# Patient Record
Sex: Male | Born: 1995 | Race: White | Hispanic: No | Marital: Single | Smoking: Never smoker
Health system: Southern US, Community
[De-identification: ages and names within clinical notes are randomized; demographics above are authoritative.]

---

## 2018-05-06 ENCOUNTER — Ambulatory Visit (INDEPENDENT_AMBULATORY_CARE_PROVIDER_SITE_OTHER): Payer: Self-pay

## 2018-05-06 ENCOUNTER — Other Ambulatory Visit: Payer: Self-pay | Admitting: Gerontology

## 2018-05-06 DIAGNOSIS — M79644 Pain in right finger(s): Secondary | ICD-10-CM

## 2018-05-06 DIAGNOSIS — S62664A Nondisplaced fracture of distal phalanx of right ring finger, initial encounter for closed fracture: Secondary | ICD-10-CM

## 2019-07-07 ENCOUNTER — Emergency Department (INDEPENDENT_AMBULATORY_CARE_PROVIDER_SITE_OTHER)
Admission: EM | Admit: 2019-07-07 | Discharge: 2019-07-07 | Disposition: A | Discharge status: Home / Self Care | Payer: Self-pay | Source: Home / Self Care | Attending: Family Medicine | Admitting: Family Medicine

## 2019-07-07 DIAGNOSIS — L255 Unspecified contact dermatitis due to plants, except food: Secondary | ICD-10-CM

## 2019-07-07 MED ORDER — PREDNISONE 20 MG PO TABS: ORAL_TABLET | ORAL | 0 refills | Status: AC

## 2019-07-07 MED ORDER — METHYLPREDNISOLONE SODIUM SUCC 125 MG IJ SOLR
80.0000 mg | Freq: Once | INTRAMUSCULAR | Status: AC
  Administered 2019-07-07: 10:00:00 80 mg via INTRAMUSCULAR

## 2019-07-07 NOTE — ED Provider Notes (Signed)
[name]    CSN: [id number] Arrival date & time: 07/07/19  0855      History   Chief Complaint Chief Complaint  Patient presents with  . Poison Oak    HPI Scott Everett is a 24 y.o. male.   Patient believes that he contacted poison oak several days ago.  Yesterday he developed mild pruritic rash and swelling above his left eye.  He has also developed scattered pruritic lesions on his forearms.  He feels well otherwise.  The history is provided by the patient.  [name] This is a new problem. The current episode started yesterday. The problem occurs constantly. The problem has been gradually worsening. Nothing aggravates the symptoms. Nothing relieves the symptoms. He has tried nothing for the symptoms.    History reviewed. No pertinent past medical history.  There are no problems to display for this patient.   History reviewed. No pertinent surgical history.     Home Medications    Prior to Admission medications   Medication Sig Start Date End Date Taking? Authorizing Provider  predniSONE (DELTASONE) 20 MG tablet Take one tab by mouth twice daily for 4 days, then one daily for 3 days. Take with food. 07/07/19   Kandra Nicolas, MD    Family History Family History  Problem Relation Age of Onset  . Hypertension Mother   . Hypertension Father   . Bell's palsy Father     Social History Social History   Tobacco Use  . Smoking status: Never Smoker  . Smokeless tobacco: Never Used  Substance Use Topics  . Alcohol use: Yes    Comment: weekly  . Drug use: Not on file     Allergies   Patient has no known allergies.   Review of Systems Review of Systems  Constitutional: Negative for chills, diaphoresis, fatigue and fever.  Skin: Positive for rash.  All other systems reviewed and are negative.    Physical Exam Triage Vital Signs ED Triage Vitals  Enc Vitals Group     BP 07/07/19 0904 (!) 142/82     Pulse Rate 07/07/19 0904 91   Resp 07/07/19 0904 16     Temp 07/07/19 0904 98.2 F (36.8 C)     Temp Source 07/07/19 0904 Oral     SpO2 07/07/19 0904 100 %     Weight --      Height --      Head Circumference --      Peak Flow --      Pain Score 07/07/19 0902 0     Pain Loc --      Pain Edu? --      Excl. in [place]? --    No data found.  Updated Vital Signs BP (!) 142/82 (BP Location: Right Arm)   Pulse 91   Temp 98.2 F (36.8 C) (Oral)   Resp 16   SpO2 100%   Visual Acuity Right Eye Distance: 20/15 Left Eye Distance: 20/15 Bilateral Distance: 20/15  Right Eye Near:   Left Eye Near:    Bilateral Near:     Physical Exam Vitals and nursing note reviewed.  Constitutional:      General: He is not in acute distress. HENT:     Head: Normocephalic.      Comments: There is mild erythema and swelling above and below patient's left eye but no tenderness to palpation.    Right Ear: External ear normal.     Left Ear: External ear  normal.     Nose: Nose normal.     Mouth/Throat:     Pharynx: Oropharynx is clear.  Eyes:     Pupils: Pupils are equal, round, and reactive to light.  Cardiovascular:     Rate and Rhythm: Normal rate.  Pulmonary:     Effort: Pulmonary effort is normal.  Musculoskeletal:        General: No swelling.  Lymphadenopathy:     Cervical: No cervical adenopathy.  Skin:    General: Skin is warm and dry.          Comments: Patient's forearms have scattered nontender erythematous macules.  Neurological:     Mental Status: He is alert.      UC Treatments / Results  Labs (all labs ordered are listed, but only abnormal results are displayed) Labs Reviewed - No data to display  EKG   Radiology No results found.  Procedures Procedures (including critical care time)  Medications Ordered in UC Medications  methylPREDNISolone sodium succinate (SOLU-MEDROL) 125 mg/2 mL injection 80 mg (has no administration in time range)    Initial Impression / Assessment and Plan / UC  Course  I have reviewed the triage vital signs and the nursing notes.  Pertinent labs & imaging results that were available during my care of the patient were reviewed by me and considered in my medical decision making (see chart for details).    Administered Solumedrol 80mg  IM, followed by prednisone burst/taper. Followup with Family Doctor if not improved in one week.   Final Clinical Impressions(s) / UC Diagnoses   Final diagnoses:  Rhus dermatitis     Discharge Instructions     Begin prednisone Wednesday 07/08/19. May take Benadryl at bedtime as needed for itching.   ED Prescriptions    Medication Sig Dispense Auth. Provider   predniSONE (DELTASONE) 20 MG tablet Take one tab by mouth twice daily for 4 days, then one daily for 3 days. Take with food. 11 tablet 07/10/19, MD        Lattie Haw, MD 07/11/19 [phone]

## 2019-07-07 NOTE — ED Triage Notes (Signed)
Patient presents to Urgent Care with complaints of itchy rash over his left eye since yesterday. 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 Pt has not applied anything to the rash, denies involvement w/ vision.

## 2019-07-07 NOTE — Discharge Instructions (Addendum)
Begin prednisone Wednesday 07/08/19. May take Benadryl at bedtime as needed for itching.

## 2019-12-30 ENCOUNTER — Other Ambulatory Visit: Payer: Self-pay | Admitting: Physician Assistant

## 2019-12-30 ENCOUNTER — Ambulatory Visit (INDEPENDENT_AMBULATORY_CARE_PROVIDER_SITE_OTHER): Payer: Self-pay

## 2019-12-30 ENCOUNTER — Other Ambulatory Visit: Payer: Self-pay

## 2019-12-30 DIAGNOSIS — R52 Pain, unspecified: Secondary | ICD-10-CM

## 2019-12-30 DIAGNOSIS — M25572 Pain in left ankle and joints of left foot: Secondary | ICD-10-CM

## 2020-01-30 IMAGING — DX DG HAND COMPLETE 3+V*R*
3 series · 3 of 3 positions shown · non-contrast
Comparison: None

CLINICAL DATA: Pain at fourth digit since pulling a fire hose at
work on 10/20/2017, no improvement

EXAM:
RIGHT HAND - COMPLETE 3+ VIEW

[hand pa]
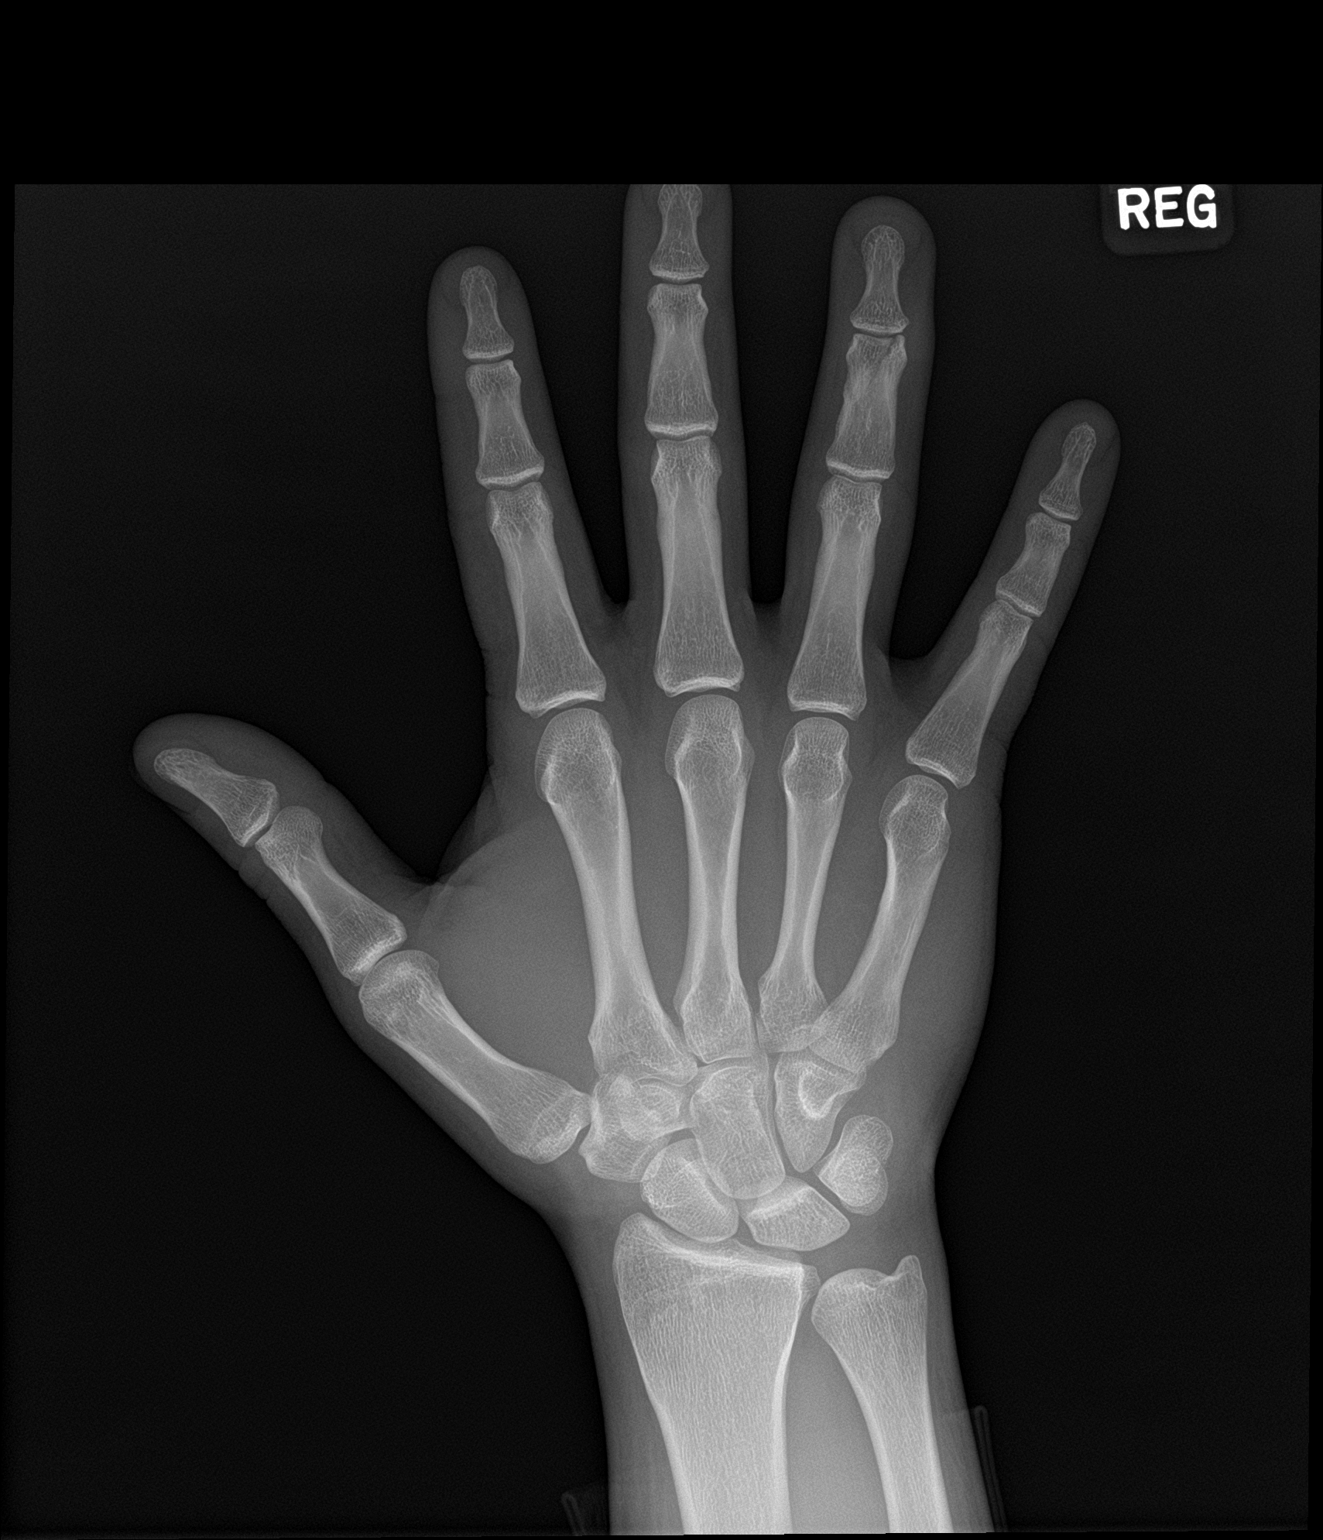

[hand obl]
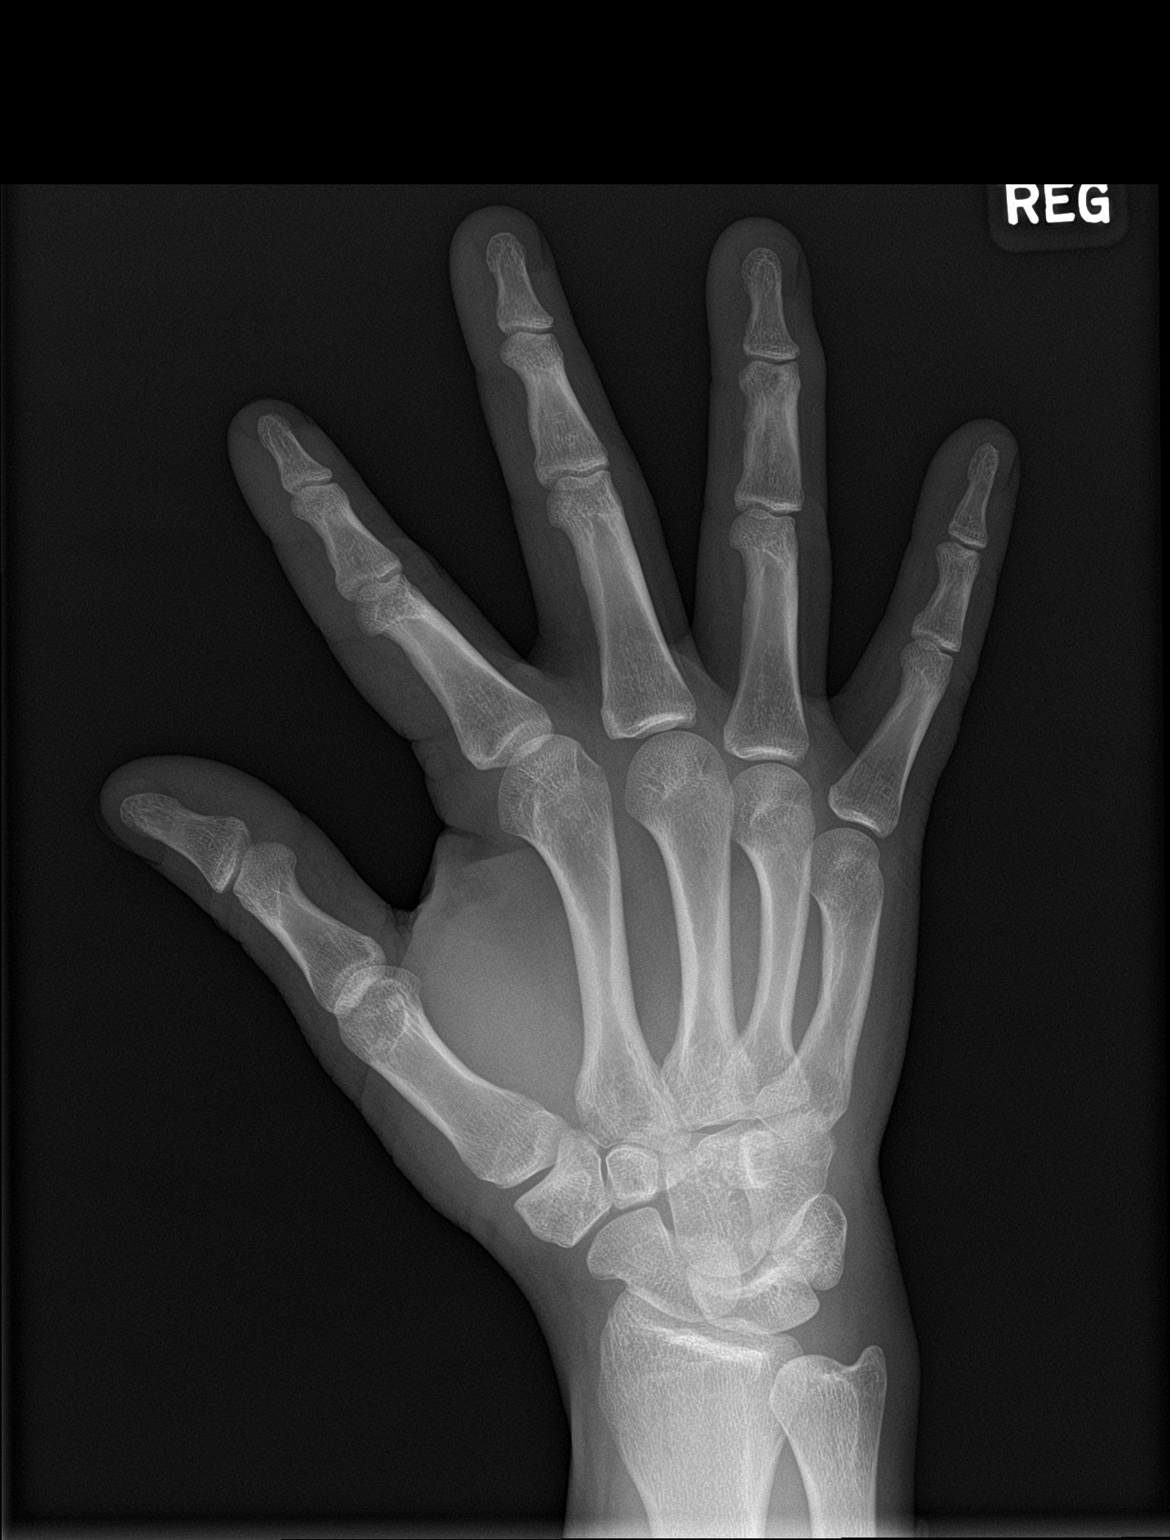

[hand lat]
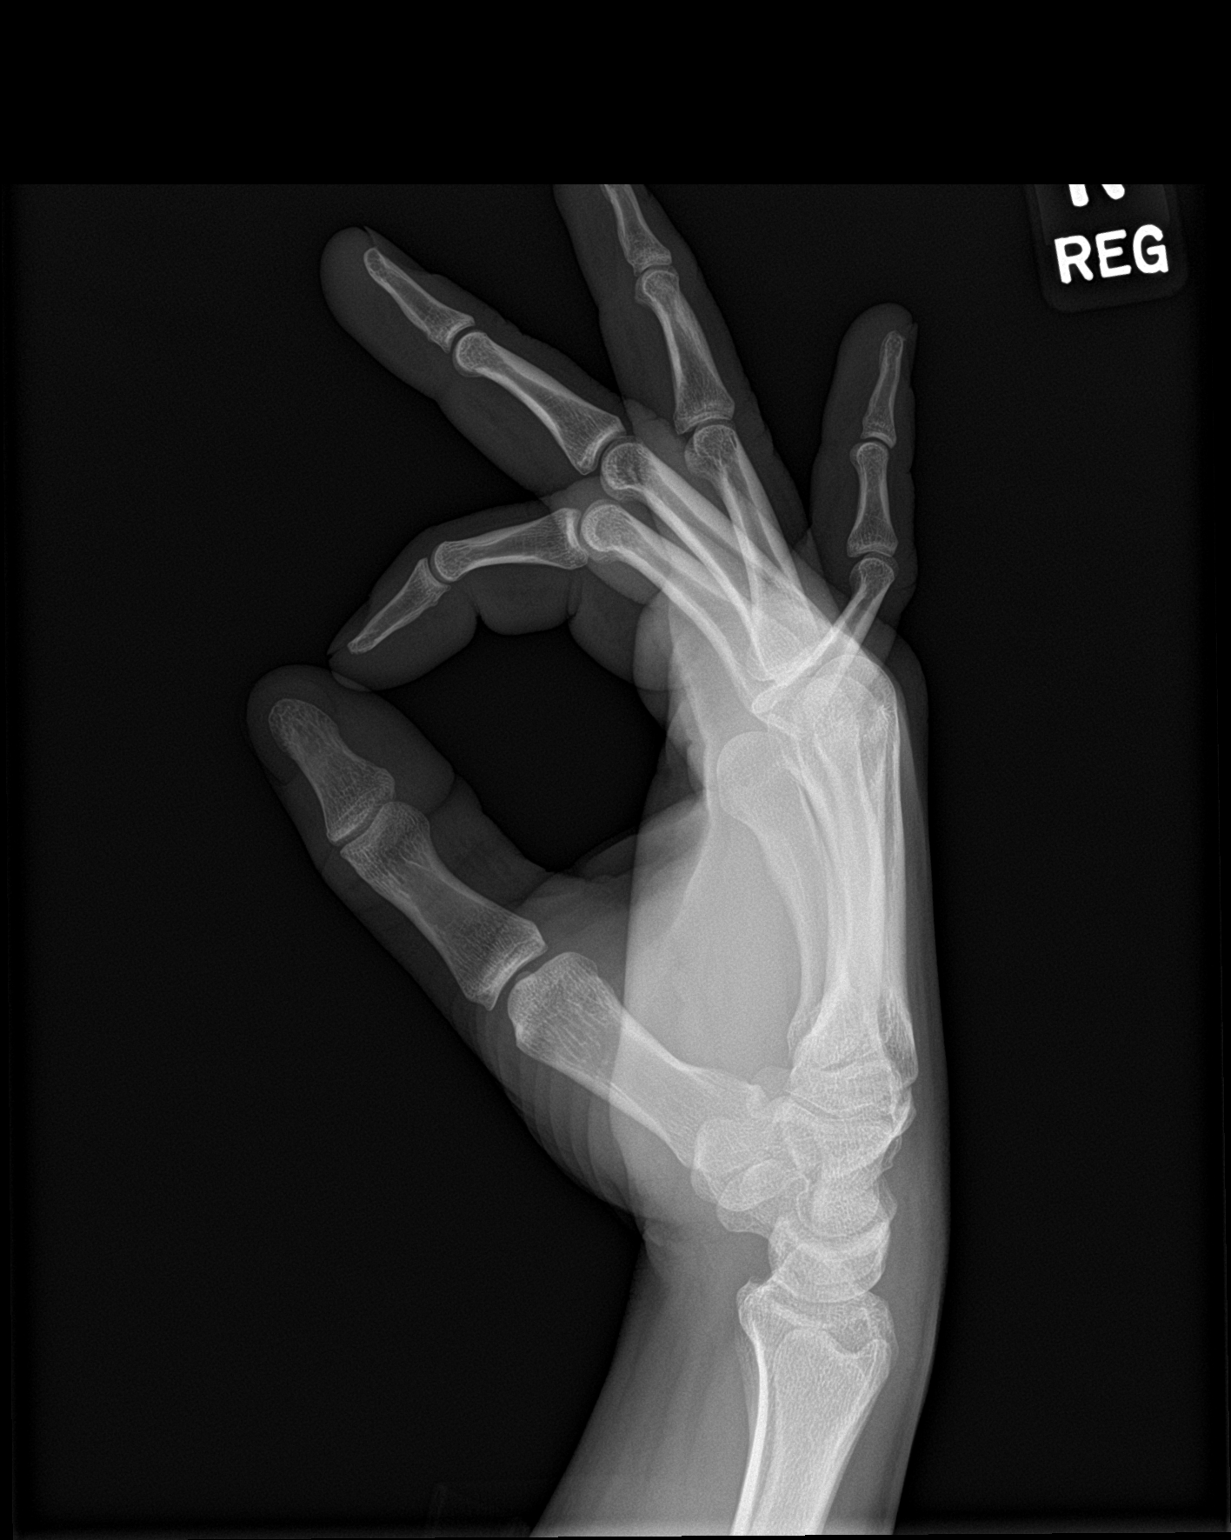

[3 of 3 positions shown; findings below may reference images not displayed]

FINDINGS: Osseous mineralization normal.

Joint spaces preserved.

Nondisplaced longitudinal fracture plane at the distal aspect of the
middle phalanx RIGHT ring finger, intra-articular at the DIP joint
and on oblique view questionably extending to the base of the middle
phalanx and potentially the PIP joint.

No additional fracture, dislocation, or bone destruction.
IMPRESSION: Nondisplaced longitudinal fracture involving the middle phalanx
RIGHT ring finger, intra-articular at DIP joint with proximal extent
questionably to the base of the middle phalanx and PIP joint.

## 2021-09-24 IMAGING — DX DG ANKLE COMPLETE 3+V*L*
3 series · 3 of 3 positions shown · non-contrast
Comparison: None.

CLINICAL DATA: Left ankle pain and swelling after injury today.

EXAM:
LEFT ANKLE COMPLETE - 3+ VIEW

[ankle ap]
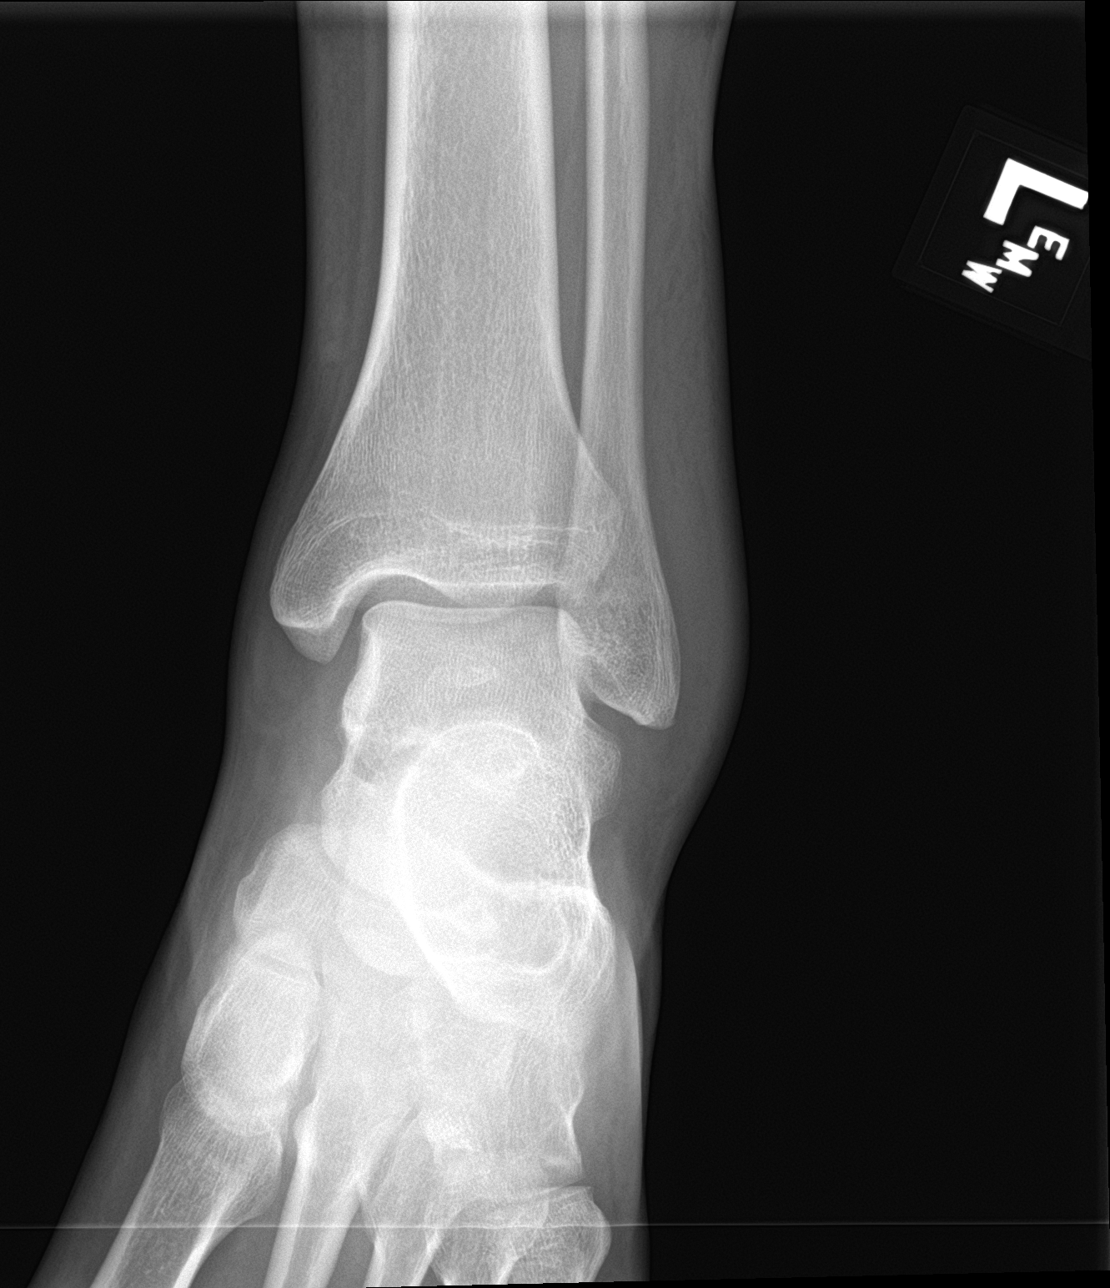

[ankle obl]
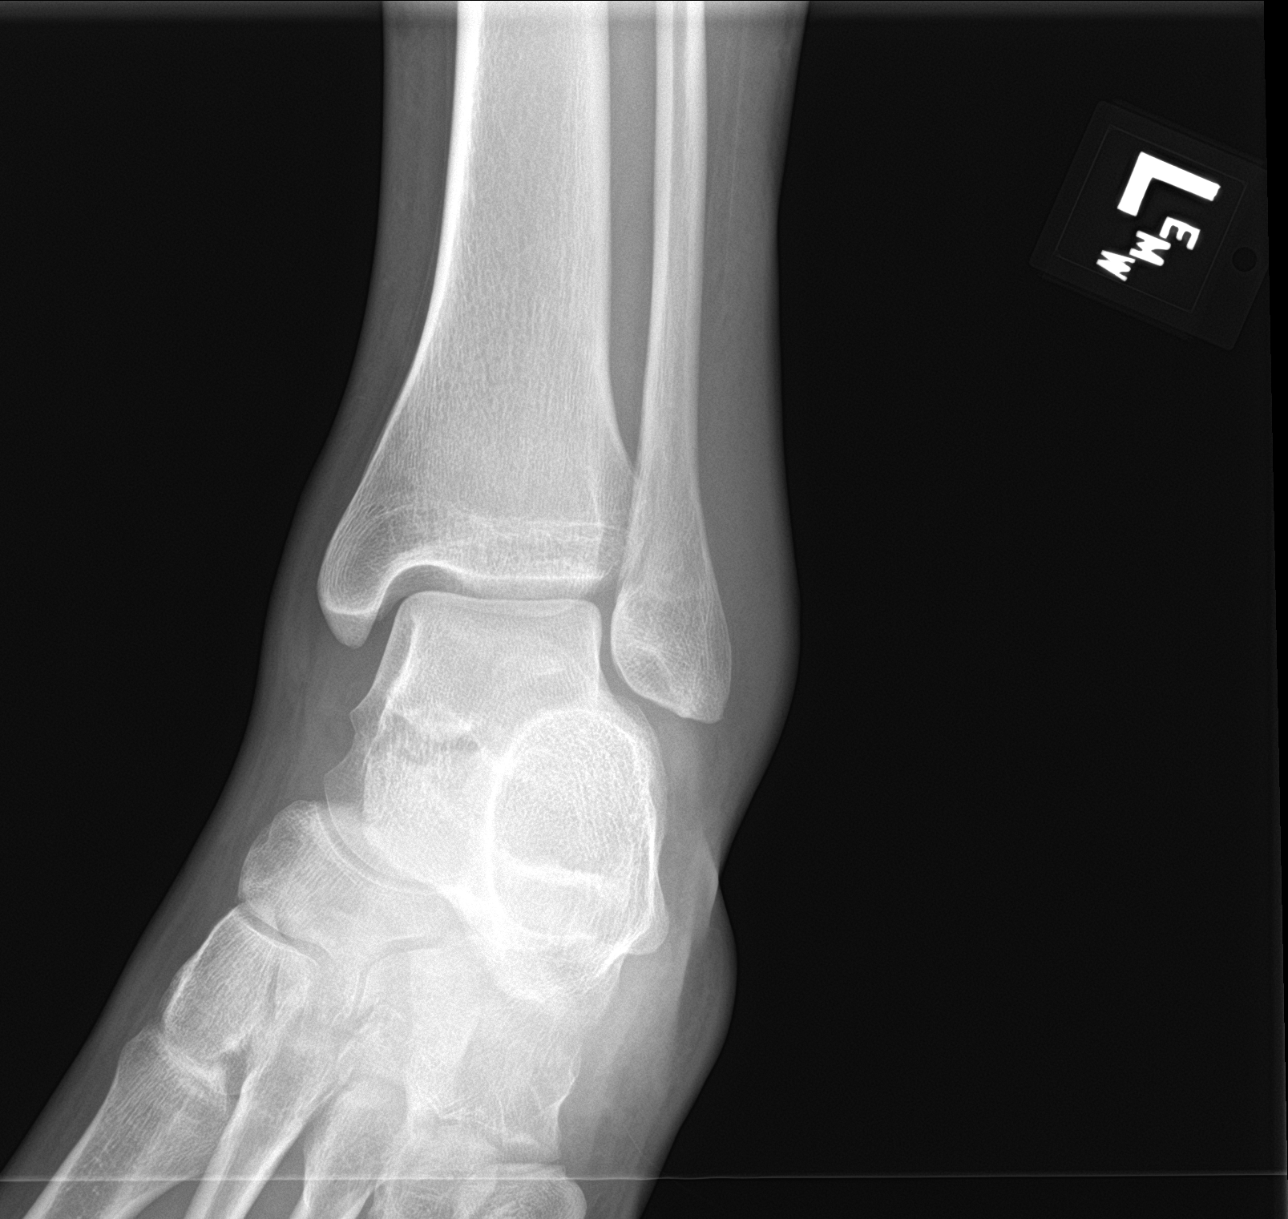

[ankle lat]
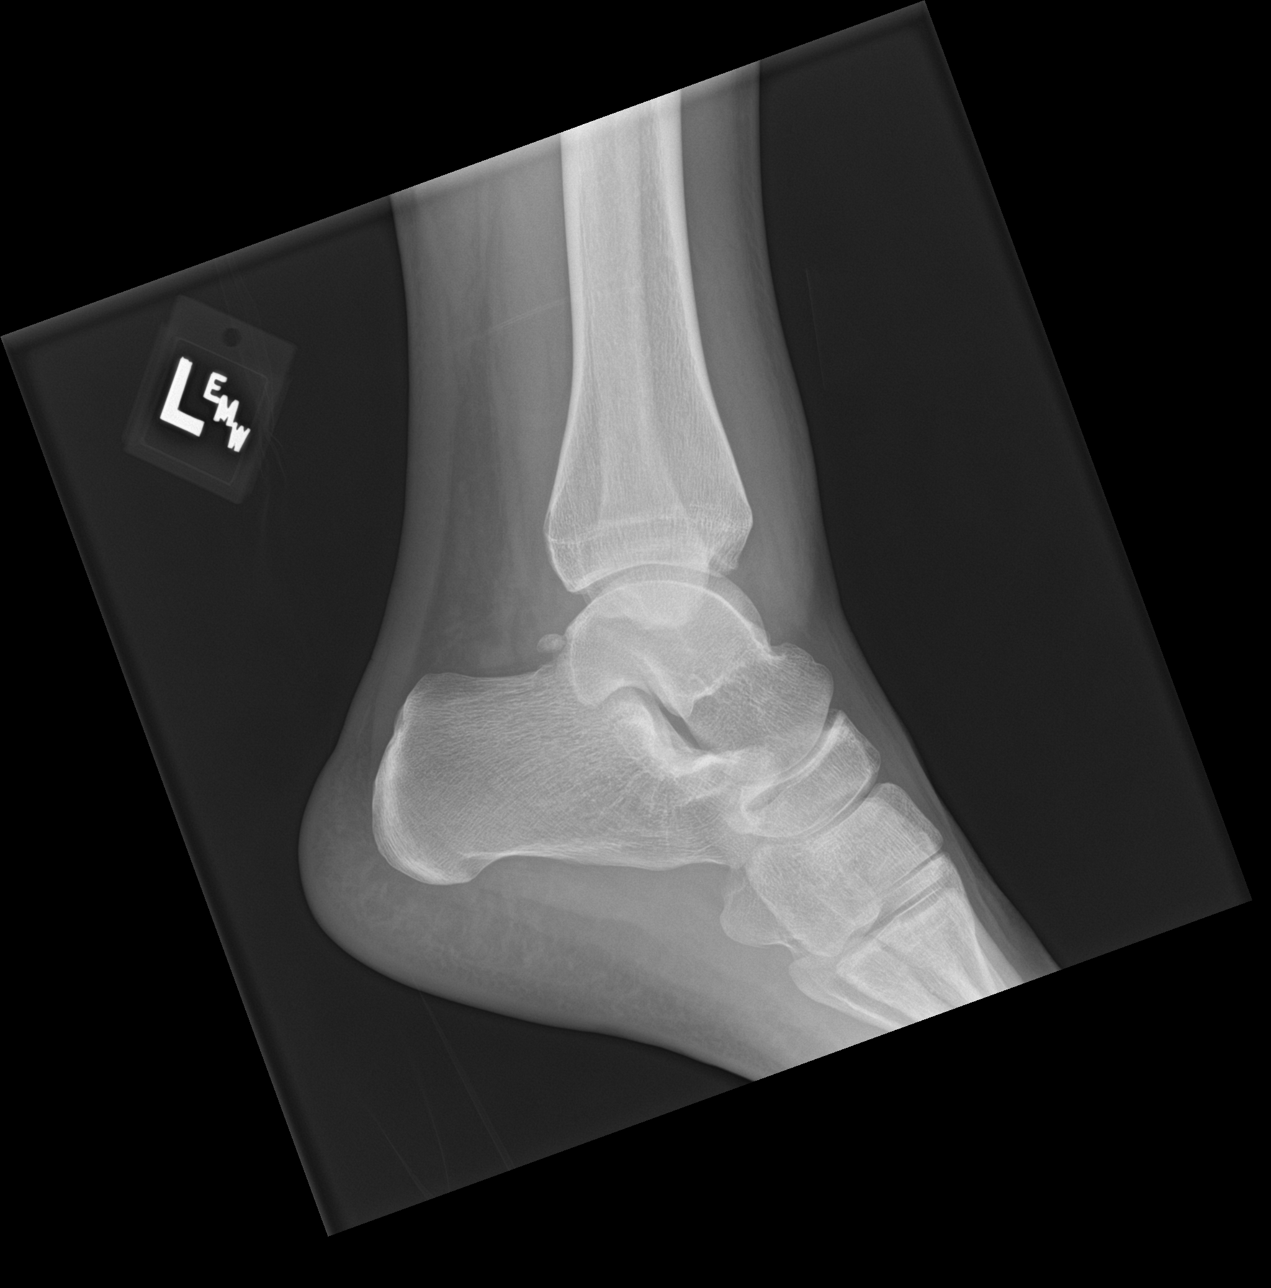

[3 of 3 positions shown; findings below may reference images not displayed]

FINDINGS: There is no evidence of fracture, dislocation, or joint effusion.
There is no evidence of arthropathy or other focal bone abnormality.
Soft tissue swelling is seen over lateral malleolus.
IMPRESSION: No fracture or dislocation is noted. Soft tissue swelling is seen
over lateral malleolus.
